# Patient Record
Sex: Female | Born: 1960
Health system: Southern US, Community
[De-identification: ages and names within clinical notes are randomized; demographics above are authoritative.]

---

## 2010-03-05 ENCOUNTER — Inpatient Hospital Stay (HOSPITAL_COMMUNITY)
Admission: AD | Admit: 2010-03-05 | Discharge: 2010-03-05 | Payer: Self-pay | Source: Home / Self Care | Attending: Obstetrics & Gynecology | Admitting: Obstetrics & Gynecology

## 2010-03-07 ENCOUNTER — Ambulatory Visit (HOSPITAL_COMMUNITY)
Admission: AD | Admit: 2010-03-07 | Discharge: 2010-03-07 | Payer: Self-pay | Source: Home / Self Care | Attending: Obstetrics & Gynecology | Admitting: Obstetrics & Gynecology

## 2010-05-31 LAB — CBC
HCT: 36 % (ref 36.0–46.0)
Hemoglobin: 11.3 g/dL — ABNORMAL LOW (ref 12.0–15.0)
MCH: 25.2 pg — ABNORMAL LOW (ref 26.0–34.0)
MCHC: 31.4 g/dL (ref 30.0–36.0)
MCV: 80.2 fL (ref 78.0–100.0)
WBC: 8.4 10*3/uL (ref 4.0–10.5)

## 2010-05-31 LAB — URINALYSIS, ROUTINE W REFLEX MICROSCOPIC
Bilirubin Urine: NEGATIVE
Glucose, UA: NEGATIVE mg/dL
Leukocytes, UA: NEGATIVE
Specific Gravity, Urine: >1.03 — ABNORMAL HIGH (ref 1.005–1.030)
pH: 5.5 (ref 5.0–8.0)

## 2010-05-31 LAB — WET PREP, GENITAL
Clue Cells Wet Prep HPF POC: NONE SEEN
Trich, Wet Prep: NONE SEEN
Yeast Wet Prep HPF POC: NONE SEEN

## 2010-05-31 LAB — GC/CHLAMYDIA PROBE AMP, GENITAL
Chlamydia, DNA Probe: NEGATIVE
GC Probe Amp, Genital: NEGATIVE

## 2010-05-31 LAB — HCG, QUANTITATIVE, PREGNANCY: hCG, Beta Chain, Quant, S: 2536 m[IU]/mL — ABNORMAL HIGH (ref <5)

## 2010-05-31 LAB — URINE MICROSCOPIC-ADD ON

## 2010-06-08 ENCOUNTER — Other Ambulatory Visit: Payer: Self-pay | Admitting: Internal Medicine

## 2010-06-08 DIAGNOSIS — Z1231 Encounter for screening mammogram for malignant neoplasm of breast: Secondary | ICD-10-CM

## 2010-07-02 ENCOUNTER — Ambulatory Visit
Admission: RE | Admit: 2010-07-02 | Discharge: 2010-07-02 | Disposition: A | Discharge status: Home / Self Care | Payer: 59 | Source: Ambulatory Visit | Attending: Internal Medicine | Admitting: Internal Medicine

## 2010-07-02 DIAGNOSIS — Z1231 Encounter for screening mammogram for malignant neoplasm of breast: Secondary | ICD-10-CM

## 2012-01-31 ENCOUNTER — Other Ambulatory Visit: Payer: Self-pay

## 2012-01-31 ENCOUNTER — Encounter (HOSPITAL_COMMUNITY): Payer: Self-pay | Admitting: *Deleted

## 2012-01-31 ENCOUNTER — Emergency Department (HOSPITAL_COMMUNITY)
Admission: EM | Admit: 2012-01-31 | Discharge: 2012-01-31 | Disposition: A | Discharge status: Home / Self Care | Payer: 59 | Attending: Emergency Medicine | Admitting: Emergency Medicine

## 2012-01-31 DIAGNOSIS — R110 Nausea: Secondary | ICD-10-CM | POA: Insufficient documentation

## 2012-01-31 DIAGNOSIS — R42 Dizziness and giddiness: Secondary | ICD-10-CM | POA: Insufficient documentation

## 2012-01-31 LAB — CBC WITH DIFFERENTIAL/PLATELET
Eosinophils Relative: 4 % (ref 0–5)
Hemoglobin: 12.7 g/dL (ref 12.0–15.0)
Lymphs Abs: 2.7 10*3/uL (ref 0.7–4.0)
Monocytes Relative: 9 % (ref 3–12)
Neutro Abs: 3.2 10*3/uL (ref 1.7–7.7)
Neutrophils Relative %: 47 % (ref 43–77)
Platelets: 268 10*3/uL (ref 150–400)
RBC: 5.08 MIL/uL (ref 3.87–5.11)
WBC: 6.8 10*3/uL (ref 4.0–10.5)

## 2012-01-31 LAB — BASIC METABOLIC PANEL
BUN: 11 mg/dL (ref 6–23)
Calcium: 8.8 mg/dL (ref 8.4–10.5)
Chloride: 103 mEq/L (ref 96–112)
Creatinine, Ser: 0.9 mg/dL (ref 0.50–1.10)
GFR calc Af Amer: 84 mL/min — ABNORMAL LOW (ref >90)
GFR calc non Af Amer: 73 mL/min — ABNORMAL LOW (ref >90)

## 2012-01-31 LAB — POCT I-STAT TROPONIN I: Troponin i, poc: 0 ng/mL (ref 0.00–0.08)

## 2012-01-31 MED ORDER — SODIUM CHLORIDE 0.9 % IV BOLUS (SEPSIS)
1000.0000 mL | Freq: Once | INTRAVENOUS | Status: AC
  Administered 2012-01-31: 1000 mL via INTRAVENOUS

## 2012-01-31 MED ORDER — MECLIZINE HCL 25 MG PO TABS: 25.0000 mg | ORAL_TABLET | Freq: Three times a day (TID) | ORAL | Status: DC | PRN

## 2012-01-31 MED ORDER — MECLIZINE HCL 25 MG PO TABS
25.0000 mg | ORAL_TABLET | Freq: Once | ORAL | Status: AC
  Administered 2012-01-31: 25 mg via ORAL
  Filled 2012-01-31: qty 1

## 2012-01-31 MED ORDER — ONDANSETRON 4 MG PO TBDP
4.0000 mg | ORAL_TABLET | Freq: Once | ORAL | Status: AC
  Administered 2012-01-31: 4 mg via ORAL
  Filled 2012-01-31: qty 1

## 2012-01-31 NOTE — ED Notes (Signed)
Heather Van Wingen, PA at bedside.  

## 2012-01-31 NOTE — ED Notes (Signed)
Intermittent dizziness for 2 days with nausea.  Pt states went to bed last nite with dizziness and then woke up 1245 today with worse dizziness.  Neuro exam wnl at triage.  No headache or ear ache

## 2012-01-31 NOTE — ED Provider Notes (Signed)
History     CSN: [id number]  Arrival date & time 01/31/12  1331   First MD Initiated Contact with Patient 01/31/12 1740      Chief Complaint  Patient presents with  . Dizziness    (Consider location/radiation/quality/duration/timing/severity/associated sxs/prior treatment) HPI Comments: Patient presents today with a chief complaint of dizziness.  She describes the dizziness as a feeling that the room is spinning.  She reports that she has had the symptoms intermittently since yesterday.  She had similar symptoms one week ago.  She reports that the symptoms typically last a few minutes and then resolve.  Symptoms are brought on when she goes from a lying to sitting position or when she turns her head.  She has had some nausea associated with symptoms, but no vomiting.  She denies any changes in vision.  Denies headache.  Denies CP or SOB.  Denies weakness.  Denies numbness or tingling.  Denies difficulty swallowing or speaking.   No history of TIA or CVA.  No history of DM, HTN, or Hyperlipidemia.    The history is provided by the patient.    History reviewed. No pertinent past medical history.  Past Surgical History  Procedure Date  . Cesarean section     No family history on file.  History  Substance Use Topics  . Smoking status: Never Smoker   . Smokeless tobacco: Not on file  . Alcohol Use: Yes     Comment: occ    OB History    Grav Para Term Preterm Abortions TAB SAB Ect Mult Living                  Review of Systems  Allergies  Review of patient's allergies indicates no known allergies.  Home Medications  No current outpatient prescriptions on file.  BP 140/81  Pulse 67  Temp 98.5 F (36.9 C) (Oral)  Resp 16  SpO2 100%  Physical Exam  Nursing note and vitals reviewed. Constitutional: She appears well-developed and well-nourished. No distress.  HENT:  Head: Normocephalic and atraumatic.  Mouth/Throat: Oropharynx is clear and moist.  Eyes: EOM are  normal. Pupils are equal, round, and reactive to light.       Horizontal nystagmus  Neck: Normal range of motion. Neck supple.  Cardiovascular: Normal rate, regular rhythm, normal heart sounds and intact distal pulses.   Pulmonary/Chest: Effort normal and breath sounds normal. No respiratory distress. She has no wheezes. She has no rales.  Abdominal: Soft. Bowel sounds are normal. There is no tenderness.  Neurological: She is alert. She has normal strength. No cranial nerve deficit or sensory deficit. Coordination and gait normal.       Normal finger to nose testing Rapid alternating movements No visual field defects Positive Dix Hallpike  Skin: Skin is warm and dry. No rash noted. She is not diaphoretic.  Psychiatric: She has a normal mood and affect.    ED Course  Procedures (including critical care time)  Labs Reviewed  BASIC METABOLIC PANEL - Abnormal; Notable for the following:    Potassium 3.4 (*)     Glucose, Bld 105 (*)     GFR calc non Af Amer 73 (*)     GFR calc Af Amer 84 (*)     All other components within normal limits  CBC WITH DIFFERENTIAL - Abnormal; Notable for the following:    MCH 25.0 (*)     All other components within normal limits   No results found.  No diagnosis found.  7:59 PM Reassessed patient.  She reports that her symptoms are improving.  However, she continues to have some vertigo with movement of the head.  8:40 PM Reassessed patient.  She reports that her symptoms have improved at this time.  Ambulated patient.  Normal gait.  No ataxia.   Date: 01/31/2012  Rate: 80  Rhythm: normal sinus rhythm  QRS Axis: normal  Intervals: normal  ST/T Wave abnormalities: normal  Conduction Disutrbances:none  Narrative Interpretation:   Old EKG Reviewed: none available   MDM  Signs and symptoms consistent with BPV.  Horizontal nystagmus present.  Positive Gilberto Better.  No focal neurological deficits on exam.  No headache.  No visual field defects.   Symptoms improved after given Antivert and after Epley Maneuver performed.  Therefore, feel that symptoms are most likely related to BPV.  Feel that CVA and TIA are unlikely in this patient with no risk factors.  Normal EKG.  Negative Troponin.        Pascal Lux [place], PA-C 02/01/12 2312

## 2012-01-31 NOTE — ED Notes (Signed)
Pt. Stated, i've been dizzy when I stand up, started last week, and the weekend it just got worse. No change in life style.  Took Bactrim this am for a UTI.  I went to [facility].

## 2012-01-31 NOTE — ED Notes (Signed)
Report received from Marylouise Stacks, RN

## 2012-01-31 NOTE — Discharge Instructions (Signed)
Benign Positional Vertigo  Vertigo means you feel like you or your surroundings are moving when they are not. Benign positional vertigo is the most common form of vertigo. Benign means that the cause of your condition is not serious. Benign positional vertigo is more common in older adults.  CAUSES   Benign positional vertigo is the result of an upset in the labyrinth system. This is an area in the middle ear that helps control your balance. This may be caused by a viral infection, head injury, or repetitive motion. However, often no specific cause is found.  SYMPTOMS   Symptoms of benign positional vertigo occur when you move your head or eyes in different directions. Some of the symptoms may include:  · Loss of balance and falls.  · Vomiting.  · Blurred vision.  · Dizziness.  · Nausea.  · Involuntary eye movements (nystagmus).  DIAGNOSIS   Benign positional vertigo is usually diagnosed by physical exam. If the specific cause of your benign positional vertigo is unknown, your caregiver may perform imaging tests, such as magnetic resonance imaging (MRI) or computed tomography (CT).  TREATMENT   Your caregiver may recommend movements or procedures to correct the benign positional vertigo. Medicines such as meclizine, benzodiazepines, and medicines for nausea may be used to treat your symptoms. In rare cases, if your symptoms are caused by certain conditions that affect the inner ear, you may need surgery.  HOME CARE INSTRUCTIONS   · Follow your caregiver's instructions.  · Move slowly. Do not make sudden body or head movements.  · Avoid driving.  · Avoid operating heavy machinery.  · Avoid performing any tasks that would be dangerous to you or others during a vertigo episode.  · Drink enough fluids to keep your urine clear or pale yellow.  SEEK IMMEDIATE MEDICAL CARE IF:   · You develop problems with walking, weakness, numbness, or using your arms, hands, or legs.  · You have difficulty speaking.  · You develop  severe headaches.  · Your nausea or vomiting continues or gets worse.  · You develop visual changes.  · Your family or friends notice any behavioral changes.  · Your condition gets worse.  · You have a fever.  · You develop a stiff neck or sensitivity to light.  MAKE SURE YOU:   · Understand these instructions.  · Will watch your condition.  · Will get help right away if you are not doing well or get worse.  Document Released: 12/13/2005 Document Revised: 05/30/2011 Document Reviewed: 11/25/2010  ExitCare® Patient Information ©2013 [employer].

## 2012-01-31 NOTE — ED Notes (Signed)
Pt is currently being treated with bactrim for a UTI

## 2012-01-31 NOTE — ED Notes (Signed)
Pt c/o dizziness still. Pt denies pain currently. Pt c/o nausea; Pt denies vomiting

## 2012-02-02 NOTE — ED Provider Notes (Signed)
Medical screening examination/treatment/procedure(s) were performed by non-physician practitioner and as supervising physician I was immediately available for consultation/collaboration.    Laray Anger, DO 02/02/12 1228

## 2012-06-25 ENCOUNTER — Other Ambulatory Visit: Payer: Self-pay

## 2012-06-25 DIAGNOSIS — Z1231 Encounter for screening mammogram for malignant neoplasm of breast: Secondary | ICD-10-CM

## 2012-07-13 ENCOUNTER — Ambulatory Visit
Admission: RE | Admit: 2012-07-13 | Discharge: 2012-07-13 | Disposition: A | Discharge status: Home / Self Care | Payer: 59 | Source: Ambulatory Visit

## 2012-07-13 DIAGNOSIS — Z1231 Encounter for screening mammogram for malignant neoplasm of breast: Secondary | ICD-10-CM

## 2014-01-10 ENCOUNTER — Emergency Department (HOSPITAL_COMMUNITY)
Admission: EM | Admit: 2014-01-10 | Discharge: 2014-01-10 | Disposition: A | Discharge status: Home / Self Care | Payer: 59 | Attending: Emergency Medicine | Admitting: Emergency Medicine

## 2014-01-10 ENCOUNTER — Encounter (HOSPITAL_COMMUNITY): Payer: Self-pay | Admitting: Emergency Medicine

## 2014-01-10 DIAGNOSIS — R110 Nausea: Secondary | ICD-10-CM | POA: Insufficient documentation

## 2014-01-10 DIAGNOSIS — R42 Dizziness and giddiness: Secondary | ICD-10-CM | POA: Diagnosis present

## 2014-01-10 DIAGNOSIS — H8110 Benign paroxysmal vertigo, unspecified ear: Secondary | ICD-10-CM | POA: Diagnosis not present

## 2014-01-10 LAB — I-STAT CHEM 8, ED
BUN: 20 mg/dL (ref 6–23)
CHLORIDE: 104 meq/L (ref 96–112)
Calcium, Ion: 1.21 mmol/L (ref 1.12–1.23)
Creatinine, Ser: 1.2 mg/dL — ABNORMAL HIGH (ref 0.50–1.10)
GLUCOSE: 106 mg/dL — AB (ref 70–99)
HEMATOCRIT: 44 % (ref 36.0–46.0)
Hemoglobin: 15 g/dL (ref 12.0–15.0)
POTASSIUM: 4 meq/L (ref 3.7–5.3)
SODIUM: 140 meq/L (ref 137–147)
TCO2: 26 mmol/L (ref 0–100)

## 2014-01-10 LAB — URINALYSIS, ROUTINE W REFLEX MICROSCOPIC
Bilirubin Urine: NEGATIVE
Glucose, UA: NEGATIVE mg/dL
Ketones, ur: NEGATIVE mg/dL
LEUKOCYTES UA: NEGATIVE
NITRITE: NEGATIVE
Protein, ur: NEGATIVE mg/dL
SPECIFIC GRAVITY, URINE: 1.022 (ref 1.005–1.030)
Urobilinogen, UA: 0.2 mg/dL (ref 0.0–1.0)
pH: 6.5 (ref 5.0–8.0)

## 2014-01-10 LAB — CBC WITH DIFFERENTIAL/PLATELET
Basophils Absolute: 0 10*3/uL (ref 0.0–0.1)
Basophils Relative: 0 % (ref 0–1)
Eosinophils Absolute: 0.3 10*3/uL (ref 0.0–0.7)
Eosinophils Relative: 5 % (ref 0–5)
HCT: 40.9 % (ref 36.0–46.0)
Hemoglobin: 12.6 g/dL (ref 12.0–15.0)
LYMPHS ABS: 3.1 10*3/uL (ref 0.7–4.0)
Lymphocytes Relative: 49 % — ABNORMAL HIGH (ref 12–46)
MCH: 25.1 pg — ABNORMAL LOW (ref 26.0–34.0)
MCHC: 30.8 g/dL (ref 30.0–36.0)
MCV: 81.5 fL (ref 78.0–100.0)
MONOS PCT: 7 % (ref 3–12)
Monocytes Absolute: 0.5 10*3/uL (ref 0.1–1.0)
NEUTROS PCT: 39 % — AB (ref 43–77)
Neutro Abs: 2.4 10*3/uL (ref 1.7–7.7)
PLATELETS: 306 10*3/uL (ref 150–400)
RBC: 5.02 MIL/uL (ref 3.87–5.11)
RDW: 14.8 % (ref 11.5–15.5)
WBC: 6.3 10*3/uL (ref 4.0–10.5)

## 2014-01-10 LAB — URINE MICROSCOPIC-ADD ON

## 2014-01-10 LAB — CBG MONITORING, ED: Glucose-Capillary: 131 mg/dL — ABNORMAL HIGH (ref 70–99)

## 2014-01-10 MED ORDER — SODIUM CHLORIDE 0.9 % IV BOLUS (SEPSIS)
1000.0000 mL | Freq: Once | INTRAVENOUS | Status: AC
  Administered 2014-01-10: 1000 mL via INTRAVENOUS

## 2014-01-10 MED ORDER — ONDANSETRON 8 MG PO TBDP: ORAL_TABLET | ORAL | Status: AC

## 2014-01-10 MED ORDER — MECLIZINE HCL 25 MG PO TABS: 25.0000 mg | ORAL_TABLET | Freq: Three times a day (TID) | ORAL | Status: AC | PRN

## 2014-01-10 MED ORDER — MECLIZINE HCL 25 MG PO TABS
25.0000 mg | ORAL_TABLET | Freq: Once | ORAL | Status: AC
  Administered 2014-01-10: 25 mg via ORAL
  Filled 2014-01-10: qty 1

## 2014-01-10 MED ORDER — ONDANSETRON HCL 4 MG/2ML IJ SOLN
4.0000 mg | Freq: Once | INTRAMUSCULAR | Status: AC
  Administered 2014-01-10: 4 mg via INTRAVENOUS
  Filled 2014-01-10: qty 2

## 2014-01-10 NOTE — ED Notes (Signed)
Dr. Palumbo at bedside. 

## 2014-01-10 NOTE — ED Provider Notes (Signed)
CSN: [id number]     Arrival date & time 01/10/14  [id number] History   First MD Initiated Contact with Patient 01/10/14 [phone]     Chief Complaint  Patient presents with  . Dizziness     (Consider location/radiation/quality/duration/timing/severity/associated sxs/prior Treatment) Patient is a 53 y.o. female presenting with dizziness. The history is provided by the patient.  Dizziness Quality:  Head spinning Severity:  Severe Onset quality:  Sudden Timing:  Constant Progression:  Unchanged Chronicity:  Recurrent Context: head movement   Context: not with loss of consciousness   Relieved by:  Nothing Worsened by:  Nothing tried Ineffective treatments:  None tried Associated symptoms: nausea   Associated symptoms: no diarrhea and no headaches   Risk factors: no anemia     History reviewed. No pertinent past medical history. Past Surgical History  Procedure Laterality Date  . Cesarean section    . Cesarean section     No family history on file. History  Substance Use Topics  . Smoking status: Never Smoker   . Smokeless tobacco: Not on file  . Alcohol Use: Yes     Comment: occ   OB History   Grav Para Term Preterm Abortions TAB SAB Ect Mult Living                 Review of Systems  Constitutional: Negative for fever.  Gastrointestinal: Positive for nausea. Negative for diarrhea.  Neurological: Positive for dizziness. Negative for facial asymmetry, speech difficulty, weakness, light-headedness, numbness and headaches.  All other systems reviewed and are negative.     Allergies  Review of patient's allergies indicates no known allergies.  Home Medications   Prior to Admission medications   Not on File   BP 120/63  Pulse 70  Temp(Src) 97.9 F (36.6 C) (Oral)  Resp 18  Ht 5\' 4"  (1.626 m)  Wt 155 lb (70.308 kg)  BMI 26.59 kg/m2  SpO2 97% Physical Exam  Constitutional: She is oriented to person, place, and time. She appears well-developed and well-nourished. No  distress.  HENT:  Head: Normocephalic and atraumatic.  Right Ear: External ear normal.  Left Ear: External ear normal.  Mouth/Throat: Oropharynx is clear and moist. No oropharyngeal exudate.  Eyes: Conjunctivae and EOM are normal. Pupils are equal, round, and reactive to light.  Neck: Normal range of motion. Neck supple.  Cardiovascular: Normal rate, regular rhythm and intact distal pulses.   Pulmonary/Chest: Effort normal and breath sounds normal. She has no wheezes. She has no rales.  Abdominal: Soft. Bowel sounds are normal. There is no tenderness. There is no rebound and no guarding.  Musculoskeletal: Normal range of motion.  Neurological: She is alert and oriented to person, place, and time. She has normal reflexes. She displays normal reflexes. No cranial nerve deficit. She exhibits normal muscle tone. Coordination normal.  Skin: Skin is warm and dry.  Psychiatric: She has a normal mood and affect.    ED Course  Procedures (including critical care time) Labs Review Labs Reviewed  URINALYSIS, ROUTINE W REFLEX MICROSCOPIC - Abnormal; Notable for the following:    Hgb urine dipstick SMALL (*)    All other components within normal limits  CBC WITH DIFFERENTIAL - Abnormal; Notable for the following:    MCH 25.1 (*)    Neutrophils Relative % 39 (*)    Lymphocytes Relative 49 (*)    All other components within normal limits  URINE MICROSCOPIC-ADD ON - Abnormal; Notable for the following:    Squamous Epithelial /  LPF FEW (*)    All other components within normal limits  CBG MONITORING, ED - Abnormal; Notable for the following:    Glucose-Capillary 131 (*)    All other components within normal limits  I-STAT CHEM 8, ED - Abnormal; Notable for the following:    Creatinine, Ser 1.20 (*)    Glucose, Bld 106 (*)    All other components within normal limits    Imaging Review No results found.   EKG Interpretation   Date/Time:  Friday January 10 2014 04:04:13 EDT Ventricular  Rate:  65 PR Interval:  138 QRS Duration: 80 QT Interval:  406 QTC Calculation: 422 R Axis:   -14 Text Interpretation:  Sinus rhythm Confirmed by [facility]  MD, Vali Capano  ([id number]) on 01/10/2014 4:06:30 AM      MDM   Final diagnoses:  None    Sudden onset intense spinning with sitting up similar to previous episode. Symptoms resolved post meclizine will treat for same.  See your PMD in follow up    Honestee Revard K Akia Desroches-Rasch, MD 01/10/14 [phone]

## 2014-01-10 NOTE — ED Notes (Signed)
Pt is an Charity fundraiserN from the floor and about an hour ago began to experience "dizzy spells again," last time was 2 years ago. Pt endorses nausea but has not vomited. A&Ox4, no neuro deficits noted at this time, speaking in clear complete sentences.

## 2014-01-10 NOTE — Discharge Instructions (Signed)
Benign Positional Vertigo Vertigo means you feel like you or your surroundings are moving when they are not. Benign positional vertigo is the most common form of vertigo. Benign means that the cause of your condition is not serious. Benign positional vertigo is more common in older adults. CAUSES  Benign positional vertigo is the result of an upset in the labyrinth system. This is an area in the middle ear that helps control your balance. This may be caused by a viral infection, head injury, or repetitive motion. However, often no specific cause is found. SYMPTOMS  Symptoms of benign positional vertigo occur when you move your head or eyes in different directions. Some of the symptoms may include:  Loss of balance and falls.  Vomiting.  Blurred vision.  Dizziness.  Nausea.  Involuntary eye movements (nystagmus). DIAGNOSIS  Benign positional vertigo is usually diagnosed by physical exam. If the specific cause of your benign positional vertigo is unknown, your caregiver may perform imaging tests, such as magnetic resonance imaging (MRI) or computed tomography (CT). TREATMENT  Your caregiver may recommend movements or procedures to correct the benign positional vertigo. Medicines such as meclizine, benzodiazepines, and medicines for nausea may be used to treat your symptoms. In rare cases, if your symptoms are caused by certain conditions that affect the inner ear, you may need surgery. HOME CARE INSTRUCTIONS   Follow your caregiver's instructions.  Move slowly. Do not make sudden body or head movements.  Avoid driving.  Avoid operating heavy machinery.  Avoid performing any tasks that would be dangerous to you or others during a vertigo episode.  Drink enough fluids to keep your urine clear or pale yellow. SEEK IMMEDIATE MEDICAL CARE IF:   You develop problems with walking, weakness, numbness, or using your arms, hands, or legs.  You have difficulty speaking.  You develop  severe headaches.  Your nausea or vomiting continues or gets worse.  You develop visual changes.  Your family or friends notice any behavioral changes.  Your condition gets worse.  You have a fever.  You develop a stiff neck or sensitivity to light. MAKE SURE YOU:   Understand these instructions.  Will watch your condition.  Will get help right away if you are not doing well or get worse. Document Released: 12/13/2005 Document Revised: 05/30/2011 Document Reviewed: 11/25/2010 ExitCare Patient Information 2015 [employer]. This information is not intended to replace advice given to you by your health care provider. Make sure you discuss any questions you have with your health care provider.    

## 2014-01-10 NOTE — ED Notes (Signed)
Per Dr. Nicanor AlconPalumbo, gave pt a room temperature ginger ale.

## 2014-02-25 ENCOUNTER — Other Ambulatory Visit (HOSPITAL_COMMUNITY): Payer: Self-pay | Admitting: Internal Medicine

## 2014-02-25 DIAGNOSIS — R42 Dizziness and giddiness: Secondary | ICD-10-CM

## 2014-02-26 ENCOUNTER — Other Ambulatory Visit: Payer: Self-pay

## 2014-02-26 ENCOUNTER — Ambulatory Visit
Admission: RE | Admit: 2014-02-26 | Discharge: 2014-02-26 | Disposition: A | Discharge status: Home / Self Care | Payer: 59 | Source: Ambulatory Visit

## 2014-02-26 DIAGNOSIS — Z1231 Encounter for screening mammogram for malignant neoplasm of breast: Secondary | ICD-10-CM

## 2014-03-01 ENCOUNTER — Ambulatory Visit (HOSPITAL_BASED_OUTPATIENT_CLINIC_OR_DEPARTMENT_OTHER)
Admission: RE | Admit: 2014-03-01 | Discharge: 2014-03-01 | Disposition: A | Discharge status: Home / Self Care | Payer: 59 | Source: Ambulatory Visit | Attending: Internal Medicine | Admitting: Internal Medicine

## 2014-03-01 DIAGNOSIS — R110 Nausea: Secondary | ICD-10-CM | POA: Insufficient documentation

## 2014-03-01 DIAGNOSIS — R42 Dizziness and giddiness: Secondary | ICD-10-CM | POA: Insufficient documentation

## 2014-03-01 MED ORDER — GADOBENATE DIMEGLUMINE 529 MG/ML IV SOLN: 14.0000 mL | Freq: Once | INTRAVENOUS | Status: AC | PRN

## 2015-02-25 ENCOUNTER — Other Ambulatory Visit (HOSPITAL_COMMUNITY): Payer: Self-pay | Admitting: Internal Medicine

## 2015-02-25 ENCOUNTER — Ambulatory Visit (HOSPITAL_COMMUNITY): Payer: 59

## 2015-02-25 DIAGNOSIS — R519 Headache, unspecified: Secondary | ICD-10-CM

## 2015-02-25 DIAGNOSIS — R51 Headache: Principal | ICD-10-CM

## 2015-02-26 ENCOUNTER — Ambulatory Visit (HOSPITAL_COMMUNITY)
Admission: RE | Admit: 2015-02-26 | Discharge: 2015-02-26 | Disposition: A | Discharge status: Home / Self Care | Payer: 59 | Source: Ambulatory Visit | Attending: Internal Medicine | Admitting: Internal Medicine

## 2015-02-26 DIAGNOSIS — R51 Headache: Secondary | ICD-10-CM | POA: Insufficient documentation

## 2015-02-26 DIAGNOSIS — R42 Dizziness and giddiness: Secondary | ICD-10-CM | POA: Diagnosis not present

## 2015-02-26 DIAGNOSIS — R519 Headache, unspecified: Secondary | ICD-10-CM

## 2015-04-03 DIAGNOSIS — Z1151 Encounter for screening for human papillomavirus (HPV): Secondary | ICD-10-CM | POA: Diagnosis not present

## 2015-04-03 DIAGNOSIS — Z1231 Encounter for screening mammogram for malignant neoplasm of breast: Secondary | ICD-10-CM | POA: Diagnosis not present

## 2015-04-03 DIAGNOSIS — Z01419 Encounter for gynecological examination (general) (routine) without abnormal findings: Secondary | ICD-10-CM | POA: Diagnosis not present

## 2015-04-03 DIAGNOSIS — N924 Excessive bleeding in the premenopausal period: Secondary | ICD-10-CM | POA: Diagnosis not present

## 2015-04-09 DIAGNOSIS — R42 Dizziness and giddiness: Secondary | ICD-10-CM | POA: Diagnosis not present

## 2015-04-09 DIAGNOSIS — G44209 Tension-type headache, unspecified, not intractable: Secondary | ICD-10-CM | POA: Diagnosis not present

## 2015-04-09 DIAGNOSIS — J4520 Mild intermittent asthma, uncomplicated: Secondary | ICD-10-CM | POA: Diagnosis not present

## 2015-04-09 DIAGNOSIS — R7301 Impaired fasting glucose: Secondary | ICD-10-CM | POA: Diagnosis not present

## 2015-06-04 MED FILL — MECLIZINE 25 MG TABLET: 25 | 10 days supply | Qty: 30 | Fill #1

## 2015-06-19 MED FILL — AZITHROMYCIN 250 MG TABLET: 250 | 5 days supply | Qty: 6 | Fill #0

## 2015-07-13 DIAGNOSIS — J4520 Mild intermittent asthma, uncomplicated: Secondary | ICD-10-CM | POA: Diagnosis not present

## 2015-07-13 DIAGNOSIS — R7301 Impaired fasting glucose: Secondary | ICD-10-CM | POA: Diagnosis not present

## 2015-07-13 DIAGNOSIS — J302 Other seasonal allergic rhinitis: Secondary | ICD-10-CM | POA: Diagnosis not present

## 2015-07-13 DIAGNOSIS — H1033 Unspecified acute conjunctivitis, bilateral: Secondary | ICD-10-CM | POA: Diagnosis not present

## 2015-07-13 MED FILL — VENTOLIN HFA 90 MCG INHALER: 108 (90 BAS | 25 days supply | Qty: 18 | Fill #0

## 2015-07-20 MED FILL — ERYTHROMYCIN EYE OINTMENT: 5 | 7 days supply | Qty: 4 | Fill #0

## 2015-08-10 DIAGNOSIS — H52223 Regular astigmatism, bilateral: Secondary | ICD-10-CM | POA: Diagnosis not present

## 2015-08-10 DIAGNOSIS — H5203 Hypermetropia, bilateral: Secondary | ICD-10-CM | POA: Diagnosis not present

## 2015-08-10 DIAGNOSIS — H524 Presbyopia: Secondary | ICD-10-CM | POA: Diagnosis not present

## 2016-01-11 DIAGNOSIS — Z13220 Encounter for screening for lipoid disorders: Secondary | ICD-10-CM | POA: Diagnosis not present

## 2016-01-11 DIAGNOSIS — M179 Osteoarthritis of knee, unspecified: Secondary | ICD-10-CM | POA: Diagnosis not present

## 2016-01-11 DIAGNOSIS — J4520 Mild intermittent asthma, uncomplicated: Secondary | ICD-10-CM | POA: Diagnosis not present

## 2016-01-11 DIAGNOSIS — R7301 Impaired fasting glucose: Secondary | ICD-10-CM | POA: Diagnosis not present

## 2016-01-11 DIAGNOSIS — E784 Other hyperlipidemia: Secondary | ICD-10-CM | POA: Diagnosis not present

## 2016-01-11 MED FILL — VENTOLIN HFA 90 MCG INHALER: 108 (90 BAS | 25 days supply | Qty: 18 | Fill #0

## 2016-01-11 MED FILL — NAPROXEN 500 MG TABLET: 500 | 30 days supply | Qty: 60 | Fill #0

## 2016-05-06 IMAGING — MR MR HEAD WO/W CM
10 of 11 series · 40 of 48 positions shown · IV contrast (multihance)
Comparison: None.

CLINICAL DATA: Dizziness, vertigo, nausea. Symptoms presents with
sudden movements beginning 03/24/2011.

EXAM:
MRI HEAD WITHOUT AND WITH CONTRAST
TECHNIQUE: Multiplanar, multiecho pulse sequences of the brain and surrounding
structures were obtained without and with intravenous contrast.
CONTRAST:  14 mL MultiHance

[Series 2: T1 · sagittal · 5.0mm · 0.45mm/px · 2 of 23 slices shown (1 of 3)]
[im 1/23]
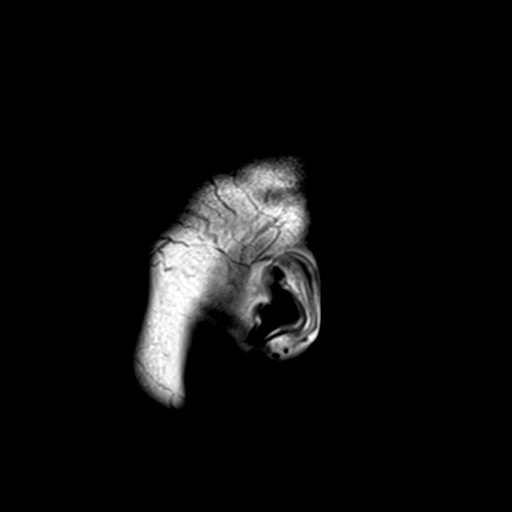
[im 23/23]
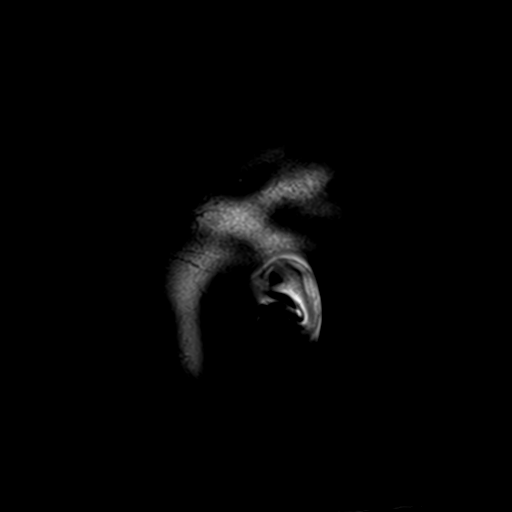

[Series 5: T2 · axial · 3.0mm · 0.72mm/px · z∈[-52,+89]mm · 6 of 48 slices shown]
[im 1/48]
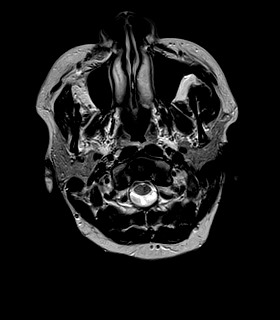
[im 10/48]
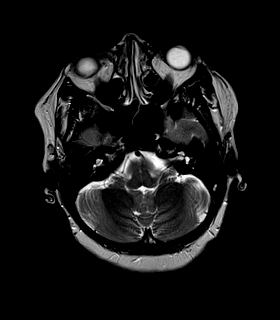
[im 19/48]
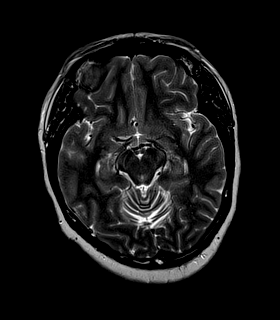
[im 29/48]
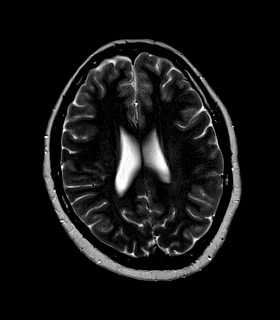
[im 38/48]
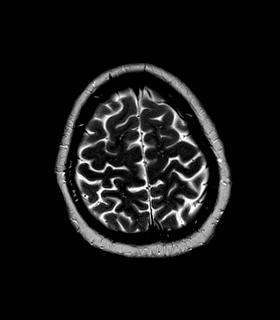
[im 48/48]
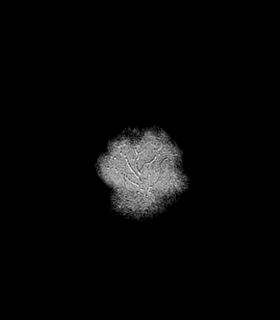

[Series 6: FLAIR · axial · 5.0mm · 0.45mm/px · z∈[-53,+90]mm · 3 of 23 slices shown]
[im 1/23]
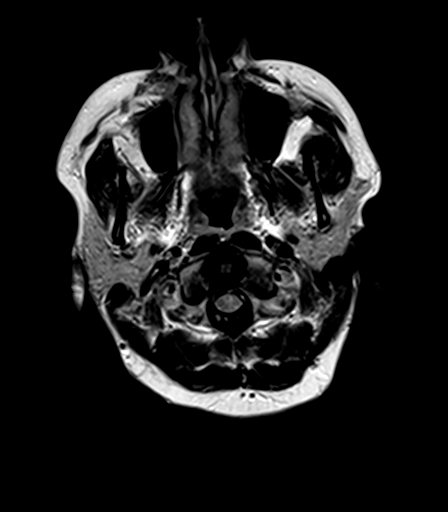
[im 12/23]
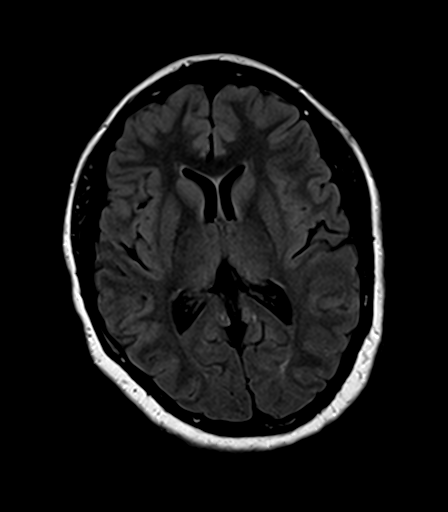
[im 23/23]
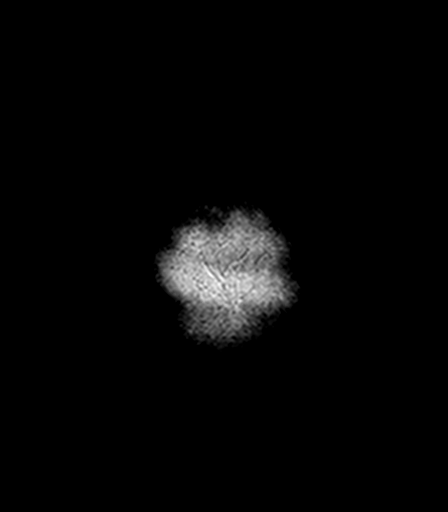

[Series 7: T1 · coronal · 3.0mm · 0.37mm/px · 2 of 11 slices shown (2 of 3)]
[im 1/11]
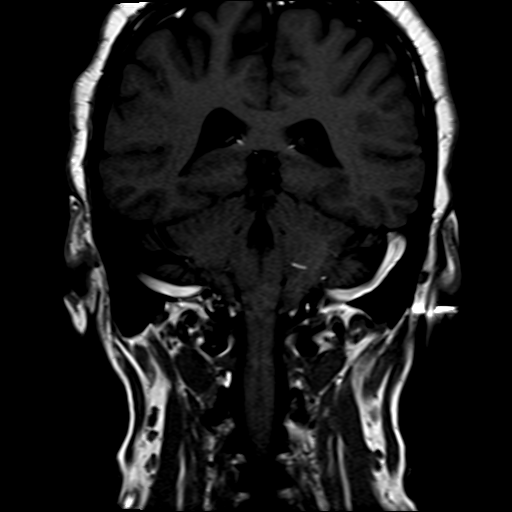
[im 11/11]
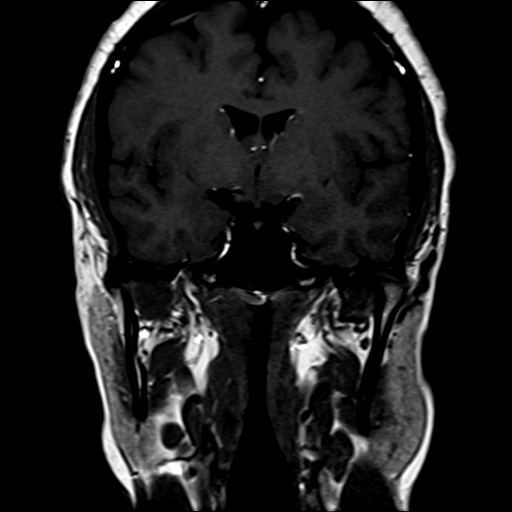

[Series 9: T1 · axial · 3.0mm · 0.37mm/px · z∈[-37,-4]mm · 2 of 11 slices shown (3 of 3)]
[im 1/11]
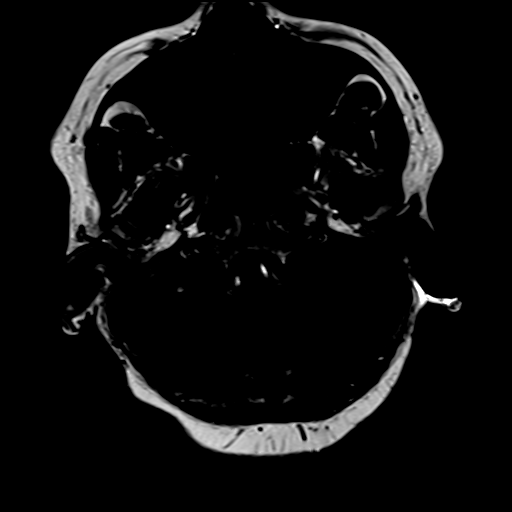
[im 11/11]
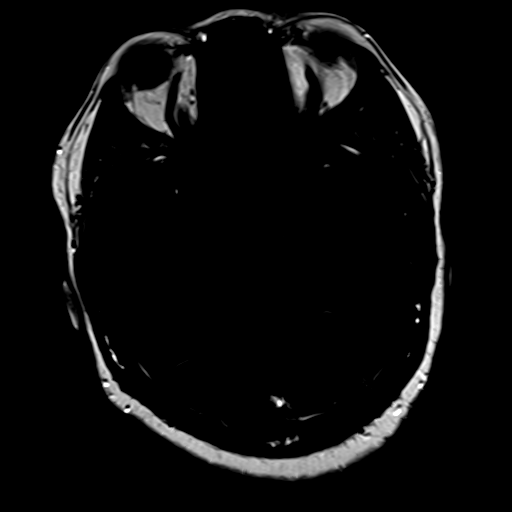

[Series 10: T1 post-contrast · axial · 3.0mm · 0.37mm/px · z∈[-37,-4]mm · 2 of 11 slices shown (1 of 3)]
[im 1/11]
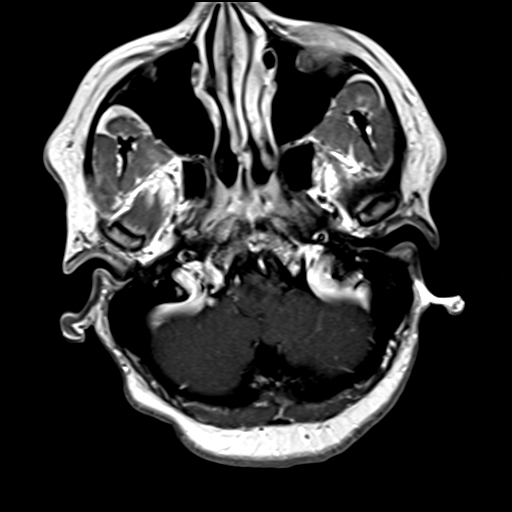
[im 11/11]
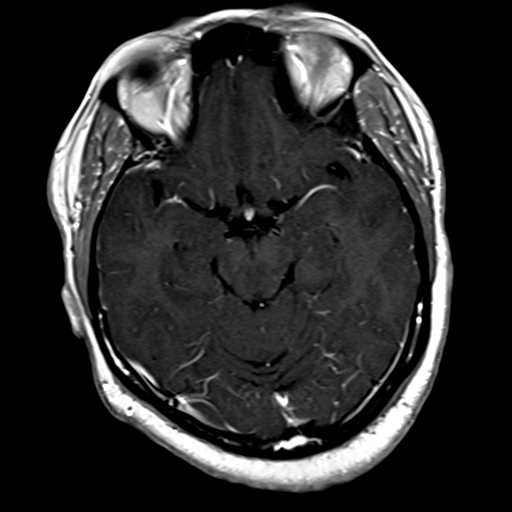

[Series 11: T1 post-contrast · coronal · 3.0mm · 0.37mm/px · 2 of 11 slices shown (2 of 3)]
[im 1/11]
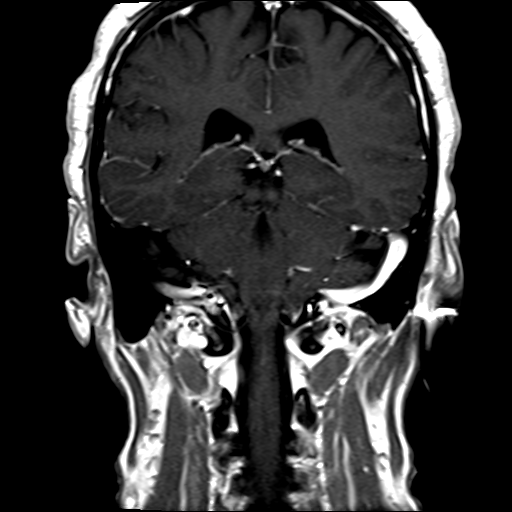
[im 11/11]
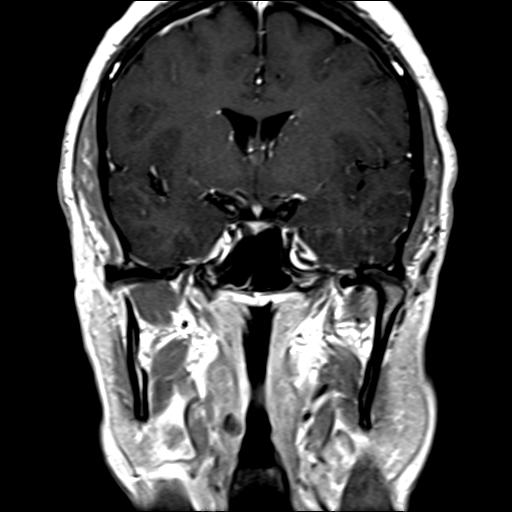

[Series 12: T1 post-contrast · axial · 3.0mm · 1.00mm/px · z∈[-58,+95]mm · 7 of 52 slices shown (3 of 3)]
[im 1/52]
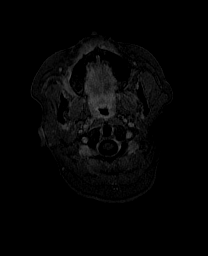
[im 9/52]
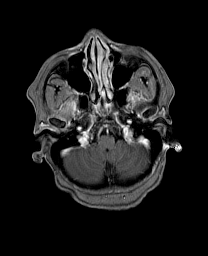
[im 18/52]
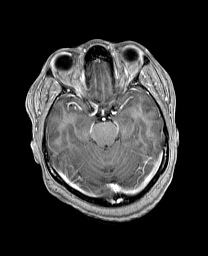
[im 26/52]
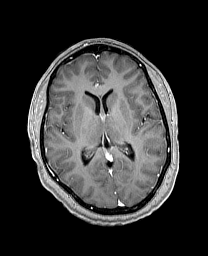
[im 35/52]
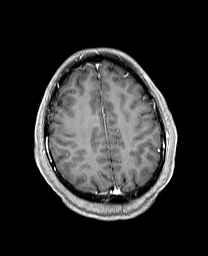
[im 43/52]
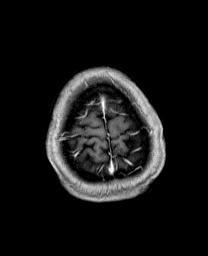
[im 52/52]
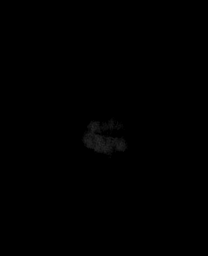

[Series 100: DWI · axial · 3.0mm · 1.20mm/px · z∈[-52,+89]mm · 7 of 48 slices shown]
[im 1/48]
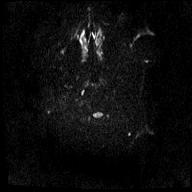
[im 8/48]
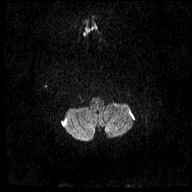
[im 16/48]
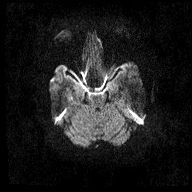
[im 24/48]
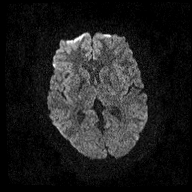
[im 32/48]
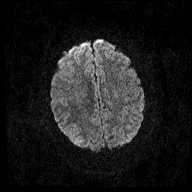
[im 40/48]
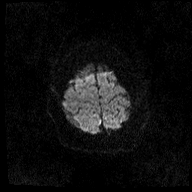
[im 48/48]
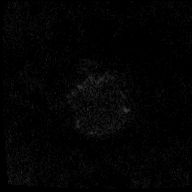

[Series 101: ADC · axial · 3.0mm · 1.20mm/px · z∈[-52,+89]mm · 7 of 48 slices shown]
[im 1/48]
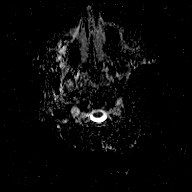
[im 8/48]
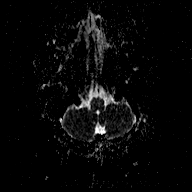
[im 16/48]
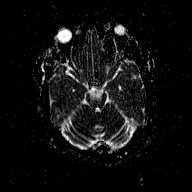
[im 24/48]
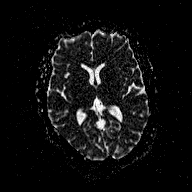
[im 32/48]
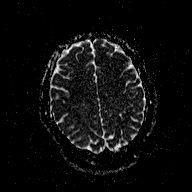
[im 40/48]
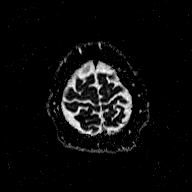
[im 48/48]
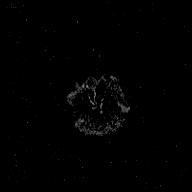

[40 of 48 positions shown; findings below may reference images not displayed]

FINDINGS: Dedicated imaging through the internal auditory canals demonstrates
a normal course of cranial nerves VII and VIII without evidence of
mass or abnormal enhancement. Inner ear structures demonstrate
normal signal bilaterally. No mass is seen within the
cerebellopontine angles.

There is no acute infarct. Ventricles and sulci are normal for age.
There is no evidence of intracranial hemorrhage, mass, midline
shift, or extra-axial fluid collection. No brain parenchymal signal
abnormality or abnormal enhancement is identified.

Orbits are unremarkable. Paranasal sinuses and mastoid air cells are
clear. Major intracranial vascular flow voids are preserved.
Calvarium and scalp soft tissues are unremarkable.
IMPRESSION: Unremarkable MRI of the brain and internal auditory canals.

## 2016-07-18 DIAGNOSIS — J4520 Mild intermittent asthma, uncomplicated: Secondary | ICD-10-CM | POA: Diagnosis not present

## 2016-07-18 DIAGNOSIS — R7301 Impaired fasting glucose: Secondary | ICD-10-CM | POA: Diagnosis not present

## 2016-07-18 DIAGNOSIS — K219 Gastro-esophageal reflux disease without esophagitis: Secondary | ICD-10-CM | POA: Diagnosis not present

## 2016-07-18 DIAGNOSIS — J302 Other seasonal allergic rhinitis: Secondary | ICD-10-CM | POA: Diagnosis not present

## 2016-07-18 DIAGNOSIS — M179 Osteoarthritis of knee, unspecified: Secondary | ICD-10-CM | POA: Diagnosis not present

## 2016-07-27 MED FILL — MELOXICAM 7.5 MG TABLET: 7.5 | 30 days supply | Qty: 60 | Fill #0

## 2016-07-27 MED FILL — predniSONE 20 MG TABS: 20 | 7 days supply | Qty: 10 | Fill #0

## 2016-07-27 MED FILL — MOMETASONE FUROATE 50 MCG S: 50 | 30 days supply | Qty: 17 | Fill #0

## 2016-08-19 DIAGNOSIS — R7301 Impaired fasting glucose: Secondary | ICD-10-CM | POA: Diagnosis not present

## 2016-08-19 DIAGNOSIS — M6580 Other synovitis and tenosynovitis, unspecified site: Secondary | ICD-10-CM | POA: Diagnosis not present

## 2016-08-19 DIAGNOSIS — J302 Other seasonal allergic rhinitis: Secondary | ICD-10-CM | POA: Diagnosis not present

## 2016-08-19 DIAGNOSIS — M179 Osteoarthritis of knee, unspecified: Secondary | ICD-10-CM | POA: Diagnosis not present

## 2016-08-19 DIAGNOSIS — J4520 Mild intermittent asthma, uncomplicated: Secondary | ICD-10-CM | POA: Diagnosis not present

## 2017-02-20 DIAGNOSIS — R7301 Impaired fasting glucose: Secondary | ICD-10-CM | POA: Diagnosis not present

## 2017-02-20 DIAGNOSIS — M179 Osteoarthritis of knee, unspecified: Secondary | ICD-10-CM | POA: Diagnosis not present

## 2017-02-20 DIAGNOSIS — K219 Gastro-esophageal reflux disease without esophagitis: Secondary | ICD-10-CM | POA: Diagnosis not present

## 2017-02-20 DIAGNOSIS — Z124 Encounter for screening for malignant neoplasm of cervix: Secondary | ICD-10-CM | POA: Diagnosis not present

## 2017-02-20 DIAGNOSIS — E2839 Other primary ovarian failure: Secondary | ICD-10-CM | POA: Diagnosis not present

## 2017-02-20 DIAGNOSIS — Z13220 Encounter for screening for lipoid disorders: Secondary | ICD-10-CM | POA: Diagnosis not present

## 2017-02-20 DIAGNOSIS — J4520 Mild intermittent asthma, uncomplicated: Secondary | ICD-10-CM | POA: Diagnosis not present

## 2017-02-20 DIAGNOSIS — Z1239 Encounter for other screening for malignant neoplasm of breast: Secondary | ICD-10-CM | POA: Diagnosis not present

## 2017-02-20 DIAGNOSIS — Z1211 Encounter for screening for malignant neoplasm of colon: Secondary | ICD-10-CM | POA: Diagnosis not present

## 2017-02-23 ENCOUNTER — Other Ambulatory Visit: Payer: Self-pay | Admitting: Internal Medicine

## 2017-02-23 DIAGNOSIS — E2839 Other primary ovarian failure: Secondary | ICD-10-CM

## 2017-03-08 MED FILL — MOMETASONE FUROATE 50 MCG S: 50 | 30 days supply | Qty: 17 | Fill #1

## 2017-07-04 MED FILL — MOMETASONE FUROATE 50 MCG S: 50 | 30 days supply | Qty: 17 | Fill #2

## 2017-08-02 DIAGNOSIS — Z6830 Body mass index (BMI) 30.0-30.9, adult: Secondary | ICD-10-CM | POA: Diagnosis not present

## 2017-08-02 DIAGNOSIS — Z1151 Encounter for screening for human papillomavirus (HPV): Secondary | ICD-10-CM | POA: Diagnosis not present

## 2017-08-02 DIAGNOSIS — Z01419 Encounter for gynecological examination (general) (routine) without abnormal findings: Secondary | ICD-10-CM | POA: Diagnosis not present

## 2017-08-02 DIAGNOSIS — Z1231 Encounter for screening mammogram for malignant neoplasm of breast: Secondary | ICD-10-CM | POA: Diagnosis not present

## 2017-10-11 DIAGNOSIS — J4520 Mild intermittent asthma, uncomplicated: Secondary | ICD-10-CM | POA: Diagnosis not present

## 2017-10-11 DIAGNOSIS — Z0000 Encounter for general adult medical examination without abnormal findings: Secondary | ICD-10-CM | POA: Diagnosis not present

## 2017-10-11 DIAGNOSIS — Z13220 Encounter for screening for lipoid disorders: Secondary | ICD-10-CM | POA: Diagnosis not present

## 2017-10-11 DIAGNOSIS — J302 Other seasonal allergic rhinitis: Secondary | ICD-10-CM | POA: Diagnosis not present

## 2017-10-11 DIAGNOSIS — R42 Dizziness and giddiness: Secondary | ICD-10-CM | POA: Diagnosis not present

## 2017-10-11 DIAGNOSIS — R7303 Prediabetes: Secondary | ICD-10-CM | POA: Diagnosis not present

## 2017-10-12 MED FILL — MECLIZINE 25 MG TABLET: 25 | 10 days supply | Qty: 30 | Fill #0

## 2018-01-05 MED FILL — PENICILLIN VK 500 MG TABLET: 500 | 10 days supply | Qty: 30 | Fill #0

## 2018-01-29 DIAGNOSIS — H52223 Regular astigmatism, bilateral: Secondary | ICD-10-CM | POA: Diagnosis not present

## 2018-01-29 DIAGNOSIS — H5203 Hypermetropia, bilateral: Secondary | ICD-10-CM | POA: Diagnosis not present

## 2018-01-29 DIAGNOSIS — H524 Presbyopia: Secondary | ICD-10-CM | POA: Diagnosis not present

## 2018-04-09 DIAGNOSIS — J4520 Mild intermittent asthma, uncomplicated: Secondary | ICD-10-CM | POA: Diagnosis not present

## 2018-04-09 DIAGNOSIS — R7303 Prediabetes: Secondary | ICD-10-CM | POA: Diagnosis not present

## 2018-04-09 DIAGNOSIS — J302 Other seasonal allergic rhinitis: Secondary | ICD-10-CM | POA: Diagnosis not present

## 2018-04-09 DIAGNOSIS — R42 Dizziness and giddiness: Secondary | ICD-10-CM | POA: Diagnosis not present

## 2018-04-09 MED FILL — MECLIZINE 25 MG TABLET: 25 | 10 days supply | Qty: 30 | Fill #0

## 2018-10-08 DIAGNOSIS — R7303 Prediabetes: Secondary | ICD-10-CM | POA: Diagnosis not present

## 2018-10-08 DIAGNOSIS — M25519 Pain in unspecified shoulder: Secondary | ICD-10-CM | POA: Diagnosis not present

## 2018-10-08 DIAGNOSIS — M179 Osteoarthritis of knee, unspecified: Secondary | ICD-10-CM | POA: Diagnosis not present

## 2018-10-08 DIAGNOSIS — E559 Vitamin D deficiency, unspecified: Secondary | ICD-10-CM | POA: Diagnosis not present

## 2018-10-08 DIAGNOSIS — Z0000 Encounter for general adult medical examination without abnormal findings: Secondary | ICD-10-CM | POA: Diagnosis not present

## 2018-10-08 DIAGNOSIS — J4520 Mild intermittent asthma, uncomplicated: Secondary | ICD-10-CM | POA: Diagnosis not present

## 2018-10-08 MED FILL — DICLOFENAC SODIUM 1 % GEL: 1 | 30 days supply | Qty: 500 | Fill #0

## 2018-10-10 DIAGNOSIS — J4520 Mild intermittent asthma, uncomplicated: Secondary | ICD-10-CM | POA: Diagnosis not present

## 2018-10-10 DIAGNOSIS — M25519 Pain in unspecified shoulder: Secondary | ICD-10-CM | POA: Diagnosis not present

## 2018-10-10 DIAGNOSIS — M179 Osteoarthritis of knee, unspecified: Secondary | ICD-10-CM | POA: Diagnosis not present

## 2018-10-10 DIAGNOSIS — Z0000 Encounter for general adult medical examination without abnormal findings: Secondary | ICD-10-CM | POA: Diagnosis not present

## 2018-10-10 DIAGNOSIS — R7303 Prediabetes: Secondary | ICD-10-CM | POA: Diagnosis not present

## 2018-11-13 MED FILL — AMOXICILLIN 500 MG CAPSULE: 500 | 7 days supply | Qty: 21 | Fill #0

## 2018-11-13 MED FILL — METHYLPREDNISOLONE 4 MG TAB: 4 | 6 days supply | Qty: 21 | Fill #0

## 2018-11-13 MED FILL — ACETAMINOPHEN/COD #3 TABLET: 300-30 | 3 days supply | Qty: 20 | Fill #0

## 2019-01-23 DIAGNOSIS — R87610 Atypical squamous cells of undetermined significance on cytologic smear of cervix (ASC-US): Secondary | ICD-10-CM | POA: Diagnosis not present

## 2019-01-23 DIAGNOSIS — Z6830 Body mass index (BMI) 30.0-30.9, adult: Secondary | ICD-10-CM | POA: Diagnosis not present

## 2019-01-23 DIAGNOSIS — Z01419 Encounter for gynecological examination (general) (routine) without abnormal findings: Secondary | ICD-10-CM | POA: Diagnosis not present

## 2019-01-23 DIAGNOSIS — Z1151 Encounter for screening for human papillomavirus (HPV): Secondary | ICD-10-CM | POA: Diagnosis not present

## 2019-01-23 DIAGNOSIS — Z1231 Encounter for screening mammogram for malignant neoplasm of breast: Secondary | ICD-10-CM | POA: Diagnosis not present

## 2019-06-28 MED FILL — MOMETASONE FUROATE 50 MCG S: 50 | 30 days supply | Qty: 17 | Fill #0

## 2019-06-28 MED FILL — MECLIZINE 25 MG TABLET: 25 | 10 days supply | Qty: 30 | Fill #0

## 2019-06-28 MED FILL — ALBUTEROL SULFATE HFA 108 (: 108 (90 BAS | 25 days supply | Qty: 9 | Fill #0

## 2019-12-05 DIAGNOSIS — H5203 Hypermetropia, bilateral: Secondary | ICD-10-CM | POA: Diagnosis not present

## 2019-12-05 DIAGNOSIS — H52223 Regular astigmatism, bilateral: Secondary | ICD-10-CM | POA: Diagnosis not present

## 2019-12-05 DIAGNOSIS — H524 Presbyopia: Secondary | ICD-10-CM | POA: Diagnosis not present

## 2019-12-09 DIAGNOSIS — R7303 Prediabetes: Secondary | ICD-10-CM | POA: Diagnosis not present

## 2019-12-09 DIAGNOSIS — Z20828 Contact with and (suspected) exposure to other viral communicable diseases: Secondary | ICD-10-CM | POA: Diagnosis not present

## 2019-12-09 DIAGNOSIS — Z23 Encounter for immunization: Secondary | ICD-10-CM | POA: Diagnosis not present

## 2019-12-09 DIAGNOSIS — Z0000 Encounter for general adult medical examination without abnormal findings: Secondary | ICD-10-CM | POA: Diagnosis not present

## 2019-12-09 DIAGNOSIS — Z20822 Contact with and (suspected) exposure to covid-19: Secondary | ICD-10-CM | POA: Diagnosis not present

## 2019-12-09 DIAGNOSIS — M179 Osteoarthritis of knee, unspecified: Secondary | ICD-10-CM | POA: Diagnosis not present

## 2019-12-09 DIAGNOSIS — Z13220 Encounter for screening for lipoid disorders: Secondary | ICD-10-CM | POA: Diagnosis not present

## 2019-12-09 DIAGNOSIS — M609 Myositis, unspecified: Secondary | ICD-10-CM | POA: Diagnosis not present

## 2019-12-09 DIAGNOSIS — J4520 Mild intermittent asthma, uncomplicated: Secondary | ICD-10-CM | POA: Diagnosis not present

## 2019-12-09 DIAGNOSIS — E559 Vitamin D deficiency, unspecified: Secondary | ICD-10-CM | POA: Diagnosis not present

## 2020-02-03 ENCOUNTER — Other Ambulatory Visit (HOSPITAL_COMMUNITY): Payer: Self-pay | Admitting: Internal Medicine

## 2020-02-03 DIAGNOSIS — J4520 Mild intermittent asthma, uncomplicated: Secondary | ICD-10-CM | POA: Diagnosis not present

## 2020-02-03 DIAGNOSIS — J302 Other seasonal allergic rhinitis: Secondary | ICD-10-CM | POA: Diagnosis not present

## 2020-02-03 DIAGNOSIS — R42 Dizziness and giddiness: Secondary | ICD-10-CM | POA: Diagnosis not present

## 2020-02-03 DIAGNOSIS — Z418 Encounter for other procedures for purposes other than remedying health state: Secondary | ICD-10-CM | POA: Diagnosis not present

## 2020-02-03 DIAGNOSIS — R200 Anesthesia of skin: Secondary | ICD-10-CM | POA: Diagnosis not present

## 2020-02-03 DIAGNOSIS — R7303 Prediabetes: Secondary | ICD-10-CM | POA: Diagnosis not present

## 2020-02-03 MED FILL — CIPROFLOXACIN HCL 500 MG TA: 500 | 15 days supply | Qty: 30 | Fill #0

## 2020-02-03 MED FILL — MECLIZINE 25 MG TABLET: 25 | 10 days supply | Qty: 30 | Fill #0

## 2020-02-03 MED FILL — MOMETASONE FUROATE 50 MCG S: 50 | 30 days supply | Qty: 17 | Fill #0

## 2020-02-03 MED FILL — ALBUTEROL SULFATE HFA 108 (: 108 (90 BAS | 25 days supply | Qty: 9 | Fill #0

## 2020-02-03 MED FILL — MEFLOQUINE HCL 250 MG TAB: 250 | 90 days supply | Qty: 13 | Fill #0

## 2020-02-25 ENCOUNTER — Other Ambulatory Visit: Payer: 59

## 2020-02-25 DIAGNOSIS — Z20822 Contact with and (suspected) exposure to covid-19: Secondary | ICD-10-CM

## 2020-02-26 LAB — SARS-COV-2, NAA 2 DAY TAT

## 2020-02-26 LAB — NOVEL CORONAVIRUS, NAA: SARS-CoV-2, NAA: NOT DETECTED

## 2020-05-11 ENCOUNTER — Other Ambulatory Visit: Payer: Self-pay | Admitting: Internal Medicine

## 2020-05-11 DIAGNOSIS — R42 Dizziness and giddiness: Secondary | ICD-10-CM | POA: Diagnosis not present

## 2020-05-11 DIAGNOSIS — R7303 Prediabetes: Secondary | ICD-10-CM | POA: Diagnosis not present

## 2020-05-11 DIAGNOSIS — Z79899 Other long term (current) drug therapy: Secondary | ICD-10-CM | POA: Diagnosis not present

## 2020-05-11 DIAGNOSIS — R200 Anesthesia of skin: Secondary | ICD-10-CM | POA: Diagnosis not present

## 2020-05-11 DIAGNOSIS — J4520 Mild intermittent asthma, uncomplicated: Secondary | ICD-10-CM | POA: Diagnosis not present

## 2020-05-12 LAB — BASIC METABOLIC PANEL WITH GFR
BUN/Creatinine Ratio: 13 (calc) (ref 6–22)
BUN: 14 mg/dL (ref 7–25)
CO2: 24 mmol/L (ref 20–32)
Calcium: 9.2 mg/dL (ref 8.6–10.4)
Chloride: 103 mmol/L (ref 98–110)
Creat: 1.11 mg/dL — ABNORMAL HIGH (ref 0.50–1.05)
GFR, Est African American: 63 mL/min/{1.73_m2} (ref >=60)
GFR, Est Non African American: 54 mL/min/{1.73_m2} — ABNORMAL LOW (ref >=60)
Glucose, Bld: 92 mg/dL (ref 65–99)
Potassium: 4.8 mmol/L (ref 3.5–5.3)
Sodium: 140 mmol/L (ref 135–146)

## 2020-05-12 LAB — LIPID PANEL
Cholesterol: 188 mg/dL (ref <200)
HDL: 49 mg/dL — ABNORMAL LOW (ref >=50)
LDL Cholesterol (Calc): 123 mg/dL (calc) — ABNORMAL HIGH
Non-HDL Cholesterol (Calc): 139 mg/dL (calc) — ABNORMAL HIGH (ref <130)
Total CHOL/HDL Ratio: 3.8 (calc) (ref <5.0)
Triglycerides: 65 mg/dL (ref <150)

## 2020-05-12 LAB — FOLATE: Folate: 6.8 ng/mL

## 2020-05-12 LAB — HEMOGLOBIN A1C W/OUT EAG: Hgb A1c MFr Bld: 6.6 % of total Hgb — ABNORMAL HIGH (ref <5.7)

## 2020-05-12 LAB — VITAMIN B12: Vitamin B-12: 536 pg/mL (ref 200–1100)

## 2020-09-08 ENCOUNTER — Other Ambulatory Visit: Payer: Self-pay | Admitting: Internal Medicine

## 2020-09-08 ENCOUNTER — Other Ambulatory Visit (HOSPITAL_COMMUNITY): Payer: Self-pay

## 2020-09-08 DIAGNOSIS — Z20822 Contact with and (suspected) exposure to covid-19: Secondary | ICD-10-CM | POA: Diagnosis not present

## 2020-09-08 DIAGNOSIS — Z20828 Contact with and (suspected) exposure to other viral communicable diseases: Secondary | ICD-10-CM | POA: Diagnosis not present

## 2020-09-08 DIAGNOSIS — R7303 Prediabetes: Secondary | ICD-10-CM | POA: Diagnosis not present

## 2020-09-08 DIAGNOSIS — U071 COVID-19: Secondary | ICD-10-CM | POA: Diagnosis not present

## 2020-09-08 DIAGNOSIS — J4520 Mild intermittent asthma, uncomplicated: Secondary | ICD-10-CM | POA: Diagnosis not present

## 2020-09-08 MED ORDER — PAXLOVID 20 X 150 MG & 10 X 100MG PO TBPK
ORAL_TABLET | ORAL | 0 refills | Status: AC
  Filled 2020-09-08: qty 30, 5d supply, fill #0

## 2020-09-08 MED FILL — Albuterol Sulfate Inhal Aero 108 MCG/ACT (90MCG Base Equiv): RESPIRATORY_TRACT | 30 days supply | Qty: 8.5 | Fill #0 | Status: AC

## 2020-09-08 MED FILL — Mometasone Furoate Nasal Susp 50 MCG/ACT: NASAL | 30 days supply | Qty: 17 | Fill #0 | Status: CN

## 2020-09-09 ENCOUNTER — Other Ambulatory Visit (HOSPITAL_COMMUNITY): Payer: Self-pay

## 2020-09-09 LAB — SARS-COV-2 RNA,(COVID-19) QUALITATIVE NAAT: SARS CoV2 RNA: NOT DETECTED

## 2020-09-09 MED ORDER — MOMETASONE FUROATE 50 MCG/ACT NA SUSP
NASAL | 5 refills | Status: AC
  Filled 2020-09-09: qty 17, 30d supply, fill #0

## 2020-09-10 ENCOUNTER — Other Ambulatory Visit (HOSPITAL_COMMUNITY): Payer: Self-pay

## 2020-09-14 ENCOUNTER — Other Ambulatory Visit (HOSPITAL_COMMUNITY): Payer: Self-pay

## 2020-09-16 ENCOUNTER — Other Ambulatory Visit (HOSPITAL_COMMUNITY): Payer: Self-pay

## 2020-09-16 DIAGNOSIS — J302 Other seasonal allergic rhinitis: Secondary | ICD-10-CM | POA: Diagnosis not present

## 2020-09-16 DIAGNOSIS — J4520 Mild intermittent asthma, uncomplicated: Secondary | ICD-10-CM | POA: Diagnosis not present

## 2020-09-16 DIAGNOSIS — R200 Anesthesia of skin: Secondary | ICD-10-CM | POA: Diagnosis not present

## 2020-09-16 MED ORDER — FLUTICASONE PROPIONATE 50 MCG/ACT NA SUSP
2.0000 | Freq: Every evening | NASAL | 5 refills | Status: AC
  Filled 2020-09-16: qty 16, 30d supply, fill #0

## 2020-09-24 ENCOUNTER — Other Ambulatory Visit (HOSPITAL_COMMUNITY): Payer: Self-pay

## 2020-09-28 ENCOUNTER — Other Ambulatory Visit (HOSPITAL_COMMUNITY): Payer: Self-pay

## 2020-09-28 DIAGNOSIS — R202 Paresthesia of skin: Secondary | ICD-10-CM | POA: Diagnosis not present

## 2020-09-28 MED ORDER — GABAPENTIN 100 MG PO CAPS
100.0000 mg | ORAL_CAPSULE | Freq: Every day | ORAL | 3 refills | Status: AC
  Filled 2020-09-28: qty 30, 30d supply, fill #0

## 2020-12-14 DIAGNOSIS — H52223 Regular astigmatism, bilateral: Secondary | ICD-10-CM | POA: Diagnosis not present

## 2020-12-14 DIAGNOSIS — H5203 Hypermetropia, bilateral: Secondary | ICD-10-CM | POA: Diagnosis not present

## 2020-12-14 DIAGNOSIS — H524 Presbyopia: Secondary | ICD-10-CM | POA: Diagnosis not present

## 2021-01-13 ENCOUNTER — Other Ambulatory Visit (HOSPITAL_COMMUNITY): Payer: Self-pay

## 2021-01-13 ENCOUNTER — Other Ambulatory Visit: Payer: Self-pay | Admitting: Internal Medicine

## 2021-01-13 DIAGNOSIS — Z1239 Encounter for other screening for malignant neoplasm of breast: Secondary | ICD-10-CM | POA: Diagnosis not present

## 2021-01-13 DIAGNOSIS — R42 Dizziness and giddiness: Secondary | ICD-10-CM | POA: Diagnosis not present

## 2021-01-13 DIAGNOSIS — J302 Other seasonal allergic rhinitis: Secondary | ICD-10-CM | POA: Diagnosis not present

## 2021-01-13 DIAGNOSIS — J4520 Mild intermittent asthma, uncomplicated: Secondary | ICD-10-CM | POA: Diagnosis not present

## 2021-01-13 DIAGNOSIS — R7303 Prediabetes: Secondary | ICD-10-CM | POA: Diagnosis not present

## 2021-01-13 DIAGNOSIS — E559 Vitamin D deficiency, unspecified: Secondary | ICD-10-CM | POA: Diagnosis not present

## 2021-01-13 DIAGNOSIS — Z0000 Encounter for general adult medical examination without abnormal findings: Secondary | ICD-10-CM | POA: Diagnosis not present

## 2021-01-13 DIAGNOSIS — Z124 Encounter for screening for malignant neoplasm of cervix: Secondary | ICD-10-CM | POA: Diagnosis not present

## 2021-01-13 MED ORDER — MECLIZINE HCL 25 MG PO TABS
25.0000 mg | ORAL_TABLET | Freq: Three times a day (TID) | ORAL | 5 refills | Status: AC | PRN
  Filled 2021-01-13: qty 30, 10d supply, fill #0

## 2021-01-14 LAB — CBC
HCT: 42.4 % (ref 35.0–45.0)
Hemoglobin: 13 g/dL (ref 11.7–15.5)
MCH: 24.7 pg — ABNORMAL LOW (ref 27.0–33.0)
MCHC: 30.7 g/dL — ABNORMAL LOW (ref 32.0–36.0)
MCV: 80.6 fL (ref 80.0–100.0)
MPV: 10.6 fL (ref 7.5–12.5)
Platelets: 295 Thousand/uL (ref 140–400)
RBC: 5.26 Million/uL — ABNORMAL HIGH (ref 3.80–5.10)
RDW: 13.5 % (ref 11.0–15.0)
WBC: 5.7 Thousand/uL (ref 3.8–10.8)

## 2021-01-14 LAB — COMPLETE METABOLIC PANEL WITHOUT GFR
AG Ratio: 1.5 (calc) (ref 1.0–2.5)
ALT: 10 U/L (ref 6–29)
AST: 16 U/L (ref 10–35)
Albumin: 4.1 g/dL (ref 3.6–5.1)
Alkaline phosphatase (APISO): 63 U/L (ref 37–153)
BUN: 16 mg/dL (ref 7–25)
CO2: 20 mmol/L (ref 20–32)
Calcium: 9 mg/dL (ref 8.6–10.4)
Chloride: 105 mmol/L (ref 98–110)
Creat: 0.97 mg/dL (ref 0.50–1.03)
Globulin: 2.7 g/dL (ref 1.9–3.7)
Glucose, Bld: 88 mg/dL (ref 65–99)
Potassium: 4.3 mmol/L (ref 3.5–5.3)
Sodium: 141 mmol/L (ref 135–146)
Total Bilirubin: 0.3 mg/dL (ref 0.2–1.2)
Total Protein: 6.8 g/dL (ref 6.1–8.1)
eGFR: 67 mL/min/1.73m2 (ref >=60)

## 2021-01-14 LAB — LIPID PANEL
Cholesterol: 169 mg/dL (ref <200)
HDL: 43 mg/dL — ABNORMAL LOW (ref >=50)
LDL Cholesterol (Calc): 102 mg/dL — ABNORMAL HIGH
Non-HDL Cholesterol (Calc): 126 mg/dL (ref <130)
Total CHOL/HDL Ratio: 3.9 (calc) (ref <5.0)
Triglycerides: 143 mg/dL (ref <150)

## 2021-01-14 LAB — VITAMIN D 25 HYDROXY (VIT D DEFICIENCY, FRACTURES): Vit D, 25-Hydroxy: 30 ng/mL (ref 30–100)

## 2021-01-14 LAB — TSH: TSH: 1.35 m[IU]/L (ref 0.40–4.50)

## 2021-01-21 ENCOUNTER — Other Ambulatory Visit: Payer: Self-pay | Admitting: *Deleted

## 2021-01-21 DIAGNOSIS — Z1231 Encounter for screening mammogram for malignant neoplasm of breast: Secondary | ICD-10-CM

## 2021-02-01 ENCOUNTER — Other Ambulatory Visit (HOSPITAL_COMMUNITY): Payer: Self-pay | Admitting: Internal Medicine

## 2021-02-01 ENCOUNTER — Other Ambulatory Visit: Payer: Self-pay | Admitting: Internal Medicine

## 2021-02-01 DIAGNOSIS — R42 Dizziness and giddiness: Secondary | ICD-10-CM

## 2021-02-06 ENCOUNTER — Ambulatory Visit (HOSPITAL_COMMUNITY): Admission: RE | Admit: 2021-02-06 | Payer: 59 | Source: Ambulatory Visit

## 2021-02-06 ENCOUNTER — Encounter (HOSPITAL_COMMUNITY): Payer: Self-pay

## 2021-02-26 DIAGNOSIS — R42 Dizziness and giddiness: Secondary | ICD-10-CM | POA: Diagnosis not present

## 2021-03-01 DIAGNOSIS — J4520 Mild intermittent asthma, uncomplicated: Secondary | ICD-10-CM | POA: Diagnosis not present

## 2021-03-01 DIAGNOSIS — R42 Dizziness and giddiness: Secondary | ICD-10-CM | POA: Diagnosis not present

## 2021-03-01 DIAGNOSIS — M179 Osteoarthritis of knee, unspecified: Secondary | ICD-10-CM | POA: Diagnosis not present

## 2021-04-28 DIAGNOSIS — R87610 Atypical squamous cells of undetermined significance on cytologic smear of cervix (ASC-US): Secondary | ICD-10-CM | POA: Diagnosis not present

## 2021-04-28 DIAGNOSIS — Z01419 Encounter for gynecological examination (general) (routine) without abnormal findings: Secondary | ICD-10-CM | POA: Diagnosis not present

## 2021-04-29 DIAGNOSIS — Z1231 Encounter for screening mammogram for malignant neoplasm of breast: Secondary | ICD-10-CM | POA: Diagnosis not present

## 2021-05-26 ENCOUNTER — Other Ambulatory Visit (HOSPITAL_COMMUNITY): Payer: Self-pay

## 2021-05-26 DIAGNOSIS — J4520 Mild intermittent asthma, uncomplicated: Secondary | ICD-10-CM | POA: Diagnosis not present

## 2021-05-26 DIAGNOSIS — K5289 Other specified noninfective gastroenteritis and colitis: Secondary | ICD-10-CM | POA: Diagnosis not present

## 2021-05-26 DIAGNOSIS — R7303 Prediabetes: Secondary | ICD-10-CM | POA: Diagnosis not present

## 2021-05-26 MED ORDER — FAMOTIDINE 20 MG PO TABS
20.0000 mg | ORAL_TABLET | Freq: Every morning | ORAL | 2 refills | Status: AC
  Filled 2021-05-26: qty 30, 30d supply, fill #0

## 2021-05-26 MED ORDER — DICYCLOMINE HCL 10 MG PO CAPS
10.0000 mg | ORAL_CAPSULE | Freq: Three times a day (TID) | ORAL | 0 refills | Status: AC | PRN
  Filled 2021-05-26: qty 10, 4d supply, fill #0

## 2021-05-28 DIAGNOSIS — K5289 Other specified noninfective gastroenteritis and colitis: Secondary | ICD-10-CM | POA: Diagnosis not present

## 2021-06-21 ENCOUNTER — Other Ambulatory Visit (HOSPITAL_COMMUNITY): Payer: Self-pay

## 2021-06-22 ENCOUNTER — Other Ambulatory Visit (HOSPITAL_COMMUNITY): Payer: Self-pay

## 2021-06-23 ENCOUNTER — Other Ambulatory Visit (HOSPITAL_COMMUNITY): Payer: Self-pay

## 2021-06-23 MED ORDER — ALBUTEROL SULFATE HFA 108 (90 BASE) MCG/ACT IN AERS
2.0000 | INHALATION_SPRAY | Freq: Four times a day (QID) | RESPIRATORY_TRACT | 5 refills | Status: AC | PRN
  Filled 2021-06-23: qty 8.5, 25d supply, fill #0

## 2021-08-11 ENCOUNTER — Other Ambulatory Visit (HOSPITAL_COMMUNITY): Payer: Self-pay

## 2021-08-11 MED ORDER — CHLORHEXIDINE GLUCONATE 0.12 % MT SOLN
Freq: Two times a day (BID) | OROMUCOSAL | 1 refills | Status: AC
  Filled 2021-08-11: qty 946, 32d supply, fill #0

## 2021-09-10 DIAGNOSIS — J4520 Mild intermittent asthma, uncomplicated: Secondary | ICD-10-CM | POA: Diagnosis not present

## 2021-09-10 DIAGNOSIS — Z1211 Encounter for screening for malignant neoplasm of colon: Secondary | ICD-10-CM | POA: Diagnosis not present

## 2021-09-10 DIAGNOSIS — J302 Other seasonal allergic rhinitis: Secondary | ICD-10-CM | POA: Diagnosis not present

## 2021-09-10 DIAGNOSIS — R7303 Prediabetes: Secondary | ICD-10-CM | POA: Diagnosis not present

## 2021-09-10 DIAGNOSIS — E669 Obesity, unspecified: Secondary | ICD-10-CM | POA: Diagnosis not present

## 2021-09-16 DIAGNOSIS — J302 Other seasonal allergic rhinitis: Secondary | ICD-10-CM | POA: Diagnosis not present

## 2021-09-16 DIAGNOSIS — R200 Anesthesia of skin: Secondary | ICD-10-CM | POA: Diagnosis not present

## 2021-09-16 DIAGNOSIS — J4520 Mild intermittent asthma, uncomplicated: Secondary | ICD-10-CM | POA: Diagnosis not present

## 2021-09-18 DIAGNOSIS — Z1211 Encounter for screening for malignant neoplasm of colon: Secondary | ICD-10-CM | POA: Diagnosis not present

## 2021-09-18 DIAGNOSIS — Z1212 Encounter for screening for malignant neoplasm of rectum: Secondary | ICD-10-CM | POA: Diagnosis not present

## 2022-01-17 ENCOUNTER — Other Ambulatory Visit (HOSPITAL_COMMUNITY): Payer: Self-pay

## 2022-01-17 DIAGNOSIS — K219 Gastro-esophageal reflux disease without esophagitis: Secondary | ICD-10-CM | POA: Diagnosis not present

## 2022-01-17 DIAGNOSIS — E559 Vitamin D deficiency, unspecified: Secondary | ICD-10-CM | POA: Diagnosis not present

## 2022-01-17 DIAGNOSIS — Z13220 Encounter for screening for lipoid disorders: Secondary | ICD-10-CM | POA: Diagnosis not present

## 2022-01-17 DIAGNOSIS — Z0000 Encounter for general adult medical examination without abnormal findings: Secondary | ICD-10-CM | POA: Diagnosis not present

## 2022-01-17 DIAGNOSIS — R7303 Prediabetes: Secondary | ICD-10-CM | POA: Diagnosis not present

## 2022-01-17 DIAGNOSIS — J302 Other seasonal allergic rhinitis: Secondary | ICD-10-CM | POA: Diagnosis not present

## 2022-01-17 DIAGNOSIS — J4520 Mild intermittent asthma, uncomplicated: Secondary | ICD-10-CM | POA: Diagnosis not present

## 2022-01-17 DIAGNOSIS — R42 Dizziness and giddiness: Secondary | ICD-10-CM | POA: Diagnosis not present

## 2022-01-17 MED ORDER — FAMOTIDINE 20 MG PO TABS
20.0000 mg | ORAL_TABLET | Freq: Every morning | ORAL | 2 refills | Status: AC
  Filled 2022-01-17: qty 30, 30d supply, fill #0

## 2022-01-17 MED ORDER — FLUTICASONE PROPIONATE 50 MCG/ACT NA SUSP
2.0000 | Freq: Every evening | NASAL | 5 refills | Status: AC
  Filled 2022-01-17: qty 16, 30d supply, fill #0

## 2022-01-17 MED ORDER — MECLIZINE HCL 25 MG PO TABS
25.0000 mg | ORAL_TABLET | Freq: Three times a day (TID) | ORAL | 5 refills | Status: AC | PRN
  Filled 2022-01-17: qty 30, 10d supply, fill #0

## 2022-01-17 MED ORDER — ALBUTEROL SULFATE HFA 108 (90 BASE) MCG/ACT IN AERS
2.0000 | INHALATION_SPRAY | Freq: Four times a day (QID) | RESPIRATORY_TRACT | 5 refills | Status: AC | PRN
  Filled 2022-01-17: qty 6.7, 20d supply, fill #0

## 2022-01-19 ENCOUNTER — Other Ambulatory Visit (HOSPITAL_COMMUNITY): Payer: Self-pay

## 2022-01-19 MED ORDER — VITAMIN D2 50 MCG (2000 UT) PO TABS: 2000.0000 [IU] | ORAL_TABLET | Freq: Every day | ORAL | 5 refills | Status: AC

## 2022-05-05 DIAGNOSIS — Z1231 Encounter for screening mammogram for malignant neoplasm of breast: Secondary | ICD-10-CM | POA: Diagnosis not present

## 2022-06-24 DIAGNOSIS — H52223 Regular astigmatism, bilateral: Secondary | ICD-10-CM | POA: Diagnosis not present

## 2022-07-04 ENCOUNTER — Other Ambulatory Visit (HOSPITAL_COMMUNITY): Payer: Self-pay

## 2022-07-04 MED ORDER — FLUTICASONE PROPIONATE 50 MCG/ACT NA SUSP
2.0000 | Freq: Every evening | NASAL | 5 refills | Status: AC
  Filled 2022-07-04: qty 16, 30d supply, fill #0

## 2022-07-04 MED ORDER — MECLIZINE HCL 25 MG PO TABS
25.0000 mg | ORAL_TABLET | Freq: Three times a day (TID) | ORAL | 5 refills | Status: AC | PRN
  Filled 2022-07-04 (×2): qty 30, 10d supply, fill #0
  Filled 2023-06-08: qty 30, 10d supply, fill #1

## 2022-07-25 ENCOUNTER — Other Ambulatory Visit (HOSPITAL_COMMUNITY): Payer: Self-pay

## 2022-07-25 DIAGNOSIS — J069 Acute upper respiratory infection, unspecified: Secondary | ICD-10-CM | POA: Diagnosis not present

## 2022-07-25 MED ORDER — PREDNISONE 20 MG PO TABS
ORAL_TABLET | ORAL | 0 refills | Status: AC
  Filled 2022-07-25: qty 10, 7d supply, fill #0

## 2022-07-25 MED ORDER — AZITHROMYCIN 250 MG PO TABS
ORAL_TABLET | ORAL | 0 refills | Status: AC
  Filled 2022-07-25: qty 6, 5d supply, fill #0

## 2022-08-01 DIAGNOSIS — J4520 Mild intermittent asthma, uncomplicated: Secondary | ICD-10-CM | POA: Diagnosis not present

## 2022-08-01 DIAGNOSIS — J302 Other seasonal allergic rhinitis: Secondary | ICD-10-CM | POA: Diagnosis not present

## 2023-01-09 DIAGNOSIS — E559 Vitamin D deficiency, unspecified: Secondary | ICD-10-CM | POA: Diagnosis not present

## 2023-01-09 DIAGNOSIS — Z0000 Encounter for general adult medical examination without abnormal findings: Secondary | ICD-10-CM | POA: Diagnosis not present

## 2023-01-09 DIAGNOSIS — R6889 Other general symptoms and signs: Secondary | ICD-10-CM | POA: Diagnosis not present

## 2023-02-03 ENCOUNTER — Other Ambulatory Visit (HOSPITAL_COMMUNITY): Payer: Self-pay

## 2023-02-03 ENCOUNTER — Other Ambulatory Visit: Payer: Self-pay

## 2023-02-03 MED ORDER — CIPROFLOXACIN HCL 500 MG PO TABS
500.0000 mg | ORAL_TABLET | Freq: Two times a day (BID) | ORAL | 0 refills | Status: AC | PRN
  Filled 2023-02-03: qty 30, 15d supply, fill #0

## 2023-02-03 MED ORDER — ATOVAQUONE-PROGUANIL HCL 250-100 MG PO TABS
1.0000 | ORAL_TABLET | Freq: Every day | ORAL | 0 refills | Status: AC
  Filled 2023-02-03 – 2023-02-15 (×2): qty 30, 30d supply, fill #0

## 2023-02-03 MED ORDER — MEFLOQUINE HCL 250 MG PO TABS
250.0000 mg | ORAL_TABLET | ORAL | 0 refills | Status: AC
  Filled 2023-02-03: qty 12, 84d supply, fill #0
  Filled 2023-02-15: qty 16, 90d supply, fill #0

## 2023-02-06 ENCOUNTER — Other Ambulatory Visit (HOSPITAL_COMMUNITY): Payer: Self-pay

## 2023-02-15 ENCOUNTER — Other Ambulatory Visit (HOSPITAL_COMMUNITY): Payer: Self-pay

## 2023-04-10 DIAGNOSIS — E7849 Other hyperlipidemia: Secondary | ICD-10-CM | POA: Diagnosis not present

## 2023-04-24 ENCOUNTER — Other Ambulatory Visit: Payer: Self-pay

## 2023-05-08 DIAGNOSIS — R87610 Atypical squamous cells of undetermined significance on cytologic smear of cervix (ASC-US): Secondary | ICD-10-CM | POA: Diagnosis not present

## 2023-05-08 DIAGNOSIS — D259 Leiomyoma of uterus, unspecified: Secondary | ICD-10-CM | POA: Diagnosis not present

## 2023-05-08 DIAGNOSIS — Z1231 Encounter for screening mammogram for malignant neoplasm of breast: Secondary | ICD-10-CM | POA: Diagnosis not present

## 2023-05-08 DIAGNOSIS — Z01419 Encounter for gynecological examination (general) (routine) without abnormal findings: Secondary | ICD-10-CM | POA: Diagnosis not present

## 2023-05-16 DIAGNOSIS — N6041 Mammary duct ectasia of right breast: Secondary | ICD-10-CM | POA: Diagnosis not present

## 2023-06-08 ENCOUNTER — Other Ambulatory Visit (HOSPITAL_COMMUNITY): Payer: Self-pay

## 2023-10-25 ENCOUNTER — Other Ambulatory Visit (HOSPITAL_COMMUNITY): Payer: Self-pay

## 2023-10-25 MED ORDER — MELOXICAM 15 MG PO TABS
15.0000 mg | ORAL_TABLET | Freq: Every day | ORAL | 2 refills | Status: AC
  Filled 2023-10-25: qty 30, 30d supply, fill #0

## 2024-02-22 ENCOUNTER — Other Ambulatory Visit (HOSPITAL_COMMUNITY): Payer: Self-pay

## 2024-02-26 ENCOUNTER — Other Ambulatory Visit (HOSPITAL_COMMUNITY): Payer: Self-pay

## 2024-02-29 DIAGNOSIS — H52223 Regular astigmatism, bilateral: Secondary | ICD-10-CM | POA: Diagnosis not present

## 2024-04-08 ENCOUNTER — Other Ambulatory Visit (HOSPITAL_COMMUNITY): Payer: Self-pay

## 2024-04-08 MED ORDER — TRIAMCINOLONE ACETONIDE 0.5 % EX CREA
1.0000 | TOPICAL_CREAM | Freq: Two times a day (BID) | CUTANEOUS | 2 refills | Status: AC | PRN
  Filled 2024-04-08: qty 15, 30d supply, fill #0

## 2024-04-09 ENCOUNTER — Other Ambulatory Visit (HOSPITAL_COMMUNITY): Payer: Self-pay
